# Patient Record
Sex: Male | Born: 1970 | Race: White | Hispanic: No | Marital: Single | Smoking: Never smoker
Health system: Southern US, Community
[De-identification: ages and names within clinical notes are randomized; demographics above are authoritative.]

## PROBLEM LIST (undated history)

## (undated) DIAGNOSIS — I10 Essential (primary) hypertension: Secondary | ICD-10-CM

## (undated) DIAGNOSIS — Z8659 Personal history of other mental and behavioral disorders: Secondary | ICD-10-CM

## (undated) DIAGNOSIS — F909 Attention-deficit hyperactivity disorder, unspecified type: Secondary | ICD-10-CM

## (undated) DIAGNOSIS — M1990 Unspecified osteoarthritis, unspecified site: Secondary | ICD-10-CM

## (undated) HISTORY — PX: KNEE ARTHROSCOPY: SUR90

## (undated) HISTORY — PX: ANTERIOR CERVICAL DECOMP/DISCECTOMY FUSION: SHX1161

## (undated) HISTORY — PX: INGUINAL HERNIA REPAIR: SHX194

## (undated) HISTORY — DX: Personal history of other mental and behavioral disorders: Z86.59

---

## 1998-11-24 ENCOUNTER — Emergency Department (HOSPITAL_COMMUNITY): Admission: EM | Admit: 1998-11-24 | Discharge: 1998-11-24 | Payer: Self-pay | Admitting: Emergency Medicine

## 1998-11-25 ENCOUNTER — Encounter: Payer: Self-pay | Admitting: Emergency Medicine

## 2000-03-15 ENCOUNTER — Encounter: Payer: Self-pay | Admitting: Family Medicine

## 2000-03-15 ENCOUNTER — Ambulatory Visit (HOSPITAL_COMMUNITY): Admission: RE | Admit: 2000-03-15 | Discharge: 2000-03-15 | Payer: Self-pay | Admitting: Family Medicine

## 2000-12-04 ENCOUNTER — Encounter: Payer: Self-pay | Admitting: Emergency Medicine

## 2000-12-04 ENCOUNTER — Emergency Department (HOSPITAL_COMMUNITY): Admission: EM | Admit: 2000-12-04 | Discharge: 2000-12-04 | Payer: Self-pay | Admitting: Emergency Medicine

## 2000-12-08 ENCOUNTER — Emergency Department (HOSPITAL_COMMUNITY): Admission: EM | Admit: 2000-12-08 | Discharge: 2000-12-08 | Payer: Self-pay | Admitting: Emergency Medicine

## 2000-12-08 ENCOUNTER — Encounter: Payer: Self-pay | Admitting: Emergency Medicine

## 2002-06-05 ENCOUNTER — Emergency Department (HOSPITAL_COMMUNITY): Admission: EM | Admit: 2002-06-05 | Discharge: 2002-06-05 | Payer: Self-pay | Admitting: *Deleted

## 2005-03-23 ENCOUNTER — Ambulatory Visit (HOSPITAL_COMMUNITY): Admission: RE | Admit: 2005-03-23 | Discharge: 2005-03-23 | Payer: Self-pay | Admitting: Neurological Surgery

## 2005-06-06 ENCOUNTER — Encounter: Admission: RE | Admit: 2005-06-06 | Discharge: 2005-06-06 | Payer: Self-pay | Admitting: Neurological Surgery

## 2007-01-17 ENCOUNTER — Emergency Department (HOSPITAL_COMMUNITY): Admission: EM | Admit: 2007-01-17 | Discharge: 2007-01-17 | Payer: Self-pay | Admitting: Emergency Medicine
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
## 2011-02-03 DIAGNOSIS — J309 Allergic rhinitis, unspecified: Secondary | ICD-10-CM | POA: Insufficient documentation

## 2015-10-16 DIAGNOSIS — F419 Anxiety disorder, unspecified: Secondary | ICD-10-CM | POA: Insufficient documentation

## 2015-10-16 DIAGNOSIS — F909 Attention-deficit hyperactivity disorder, unspecified type: Secondary | ICD-10-CM | POA: Insufficient documentation

## 2018-04-01 DIAGNOSIS — K420 Umbilical hernia with obstruction, without gangrene: Secondary | ICD-10-CM | POA: Insufficient documentation

## 2020-09-08 DIAGNOSIS — M542 Cervicalgia: Secondary | ICD-10-CM | POA: Insufficient documentation

## 2020-09-23 ENCOUNTER — Other Ambulatory Visit: Payer: Self-pay | Admitting: Neurological Surgery

## 2020-09-23 DIAGNOSIS — M542 Cervicalgia: Secondary | ICD-10-CM

## 2020-10-13 ENCOUNTER — Ambulatory Visit
Admission: RE | Admit: 2020-10-13 | Discharge: 2020-10-13 | Disposition: A | Discharge status: Home / Self Care | Payer: Worker's Compensation | Source: Ambulatory Visit | Attending: Neurological Surgery | Admitting: Neurological Surgery

## 2020-10-13 ENCOUNTER — Other Ambulatory Visit: Payer: Self-pay

## 2020-10-13 DIAGNOSIS — M542 Cervicalgia: Secondary | ICD-10-CM

## 2020-11-08 ENCOUNTER — Ambulatory Visit
Admission: RE | Admit: 2020-11-08 | Discharge: 2020-11-08 | Disposition: A | Discharge status: Home / Self Care | Payer: Worker's Compensation | Source: Ambulatory Visit | Attending: Neurological Surgery | Admitting: Neurological Surgery

## 2020-11-08 ENCOUNTER — Other Ambulatory Visit: Payer: Self-pay

## 2021-04-07 DIAGNOSIS — M5030 Other cervical disc degeneration, unspecified cervical region: Secondary | ICD-10-CM | POA: Insufficient documentation
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
## 2021-11-16 ENCOUNTER — Other Ambulatory Visit: Payer: Self-pay

## 2021-11-16 ENCOUNTER — Encounter (HOSPITAL_BASED_OUTPATIENT_CLINIC_OR_DEPARTMENT_OTHER): Payer: Self-pay | Admitting: Orthopaedic Surgery

## 2021-11-21 NOTE — Discharge Instructions (Addendum)
[name] Ramanathan, MD ?[facility] ? ?Please read the following information regarding your care after surgery. ? ?Medications  ?You only need a prescription for the narcotic pain medicine (ex. oxycodone, Percocet, Norco).  All of the other medicines listed below are available over the counter. ?? Aleve 2 pills twice a day for the first 3 days after surgery. ?? acetominophen (Tylenol) 650 mg every 4-6 hours as you need for minor to moderate pain ?? oxycodone as prescribed for severe pain ? ?? To help prevent blood clots, take aspirin (81 mg) twice daily for 42 days after surgery (or total duration of nonweightbearing).  You should also get up every hour while you are awake to move around. ? ?Weight Bearing ?? Do NOT bear any weight on the operated leg or foot. ? ?Cast / Splint / Dressing ?? If you have a splint, do NOT remove this. Keep your splint, cast or dressing clean and dry.  Don?t put anything (coat hanger, pencil, etc) down inside of it.  If it gets wet, call the office immediately to schedule an appointment for a cast change. ? ?Swelling ?IMPORTANT: It is normal for you to have swelling where you had surgery. To reduce swelling and pain, keep at least 3 pillows under your leg so that your toes are above your nose and your heel is above the level of your hip.  It may be necessary to keep your foot or leg elevated for several weeks.  This is critical to helping your incisions heal and your pain to feel better. ? ?Follow Up ?Call my office at [phone] when you are discharged from the hospital or surgery center to schedule an appointment to be seen 7-10 days after surgery. ? ?Call my office at [phone] if you develop a fever >101.5? F, nausea, vomiting, bleeding from the surgical site or severe pain.   ? ?=============== ?Post Anesthesia Home Care Instructions ? ?Activity: ?Get plenty of rest for the remainder of the day. A responsible individual must stay with you for 24 hours following the procedure.   ?For the next 24 hours, DO NOT: ?-Drive a car ?-Operate machinery ?-Drink alcoholic beverages ?-Take any medication unless instructed by your physician ?-Make any legal decisions or sign important papers. ? ?Meals: ?Start with liquid foods such as gelatin or soup. Progress to regular foods as tolerated. Avoid greasy, spicy, heavy foods. If nausea and/or vomiting occur, drink only clear liquids until the nausea and/or vomiting subsides. Call your physician if vomiting continues. ? ?Special Instructions/Symptoms: ?Your throat may feel dry or sore from the anesthesia or the breathing tube placed in your throat during surgery. If this causes discomfort, gargle with warm salt water. The discomfort should disappear within 24 hours. ? ?If you had a scopolamine patch placed behind your ear for the management of post- operative nausea and/or vomiting: ? ?1. The medication in the patch is effective for 72 hours, after which it should be removed.  Wrap patch in a tissue and discard in the trash. Wash hands thoroughly with soap and water. ?2. You may remove the patch earlier than 72 hours if you experience unpleasant side effects which may include dry mouth, dizziness or visual disturbances. ?3. Avoid touching the patch. Wash your hands with soap and water after contact with the patch. ?   Regional Anesthesia Blocks ? ?1. Numbness or the inability to move the "blocked" extremity may last from 3-48 hours after placement. The length of time depends on the medication injected and your individual   response to the medication. If the numbness is not going away after 48 hours, call your surgeon. ? ?2. The extremity that is blocked will need to be protected until the numbness is gone and the  Strength has returned. Because you cannot feel it, you will need to take extra care to avoid injury. Because it may be weak, you may have difficulty moving it or using it. You may not know what position it is in without looking at it while the  block is in effect. ? ?3. For blocks in the legs and feet, returning to weight bearing and walking needs to be done carefully. You will need to wait until the numbness is entirely gone and the strength has returned. You should be able to move your leg and foot normally before you try and bear weight or walk. You will need someone to be with you when you first try to ensure you do not fall and possibly risk injury. ? ?4. Bruising and tenderness at the needle site are common side effects and will resolve in a few days. ? ?5. Persistent numbness or new problems with movement should be communicated to the surgeon or the [facility] ([phone])/ [facility] ([phone]).  ?

## 2021-11-24 ENCOUNTER — Other Ambulatory Visit: Payer: Self-pay

## 2021-11-24 ENCOUNTER — Ambulatory Visit (HOSPITAL_BASED_OUTPATIENT_CLINIC_OR_DEPARTMENT_OTHER)
Admission: RE | Admit: 2021-11-24 | Discharge: 2021-11-24 | Disposition: A | Discharge status: Home / Self Care | Payer: Worker's Compensation | Attending: Orthopaedic Surgery | Admitting: Orthopaedic Surgery

## 2021-11-24 ENCOUNTER — Ambulatory Visit (HOSPITAL_BASED_OUTPATIENT_CLINIC_OR_DEPARTMENT_OTHER): Payer: Worker's Compensation | Admitting: Anesthesiology

## 2021-11-24 ENCOUNTER — Ambulatory Visit (HOSPITAL_BASED_OUTPATIENT_CLINIC_OR_DEPARTMENT_OTHER): Payer: Worker's Compensation

## 2021-11-24 ENCOUNTER — Encounter (HOSPITAL_BASED_OUTPATIENT_CLINIC_OR_DEPARTMENT_OTHER): Payer: Self-pay | Admitting: Orthopaedic Surgery

## 2021-11-24 ENCOUNTER — Encounter (HOSPITAL_BASED_OUTPATIENT_CLINIC_OR_DEPARTMENT_OTHER)
Admission: RE | Disposition: A | Discharge status: Home / Self Care | Payer: Self-pay | Source: Home / Self Care | Attending: Orthopaedic Surgery

## 2021-11-24 DIAGNOSIS — S93401A Sprain of unspecified ligament of right ankle, initial encounter: Secondary | ICD-10-CM | POA: Insufficient documentation

## 2021-11-24 DIAGNOSIS — X58XXXA Exposure to other specified factors, initial encounter: Secondary | ICD-10-CM | POA: Insufficient documentation

## 2021-11-24 DIAGNOSIS — M1990 Unspecified osteoarthritis, unspecified site: Secondary | ICD-10-CM

## 2021-11-24 DIAGNOSIS — M25371 Other instability, right ankle: Secondary | ICD-10-CM | POA: Insufficient documentation

## 2021-11-24 DIAGNOSIS — S93491A Sprain of other ligament of right ankle, initial encounter: Secondary | ICD-10-CM

## 2021-11-24 DIAGNOSIS — I10 Essential (primary) hypertension: Secondary | ICD-10-CM | POA: Diagnosis not present

## 2021-11-24 DIAGNOSIS — Z6841 Body Mass Index (BMI) 40.0 and over, adult: Secondary | ICD-10-CM | POA: Diagnosis not present

## 2021-11-24 HISTORY — DX: Essential (primary) hypertension: I10

## 2021-11-24 HISTORY — DX: Attention-deficit hyperactivity disorder, unspecified type: F90.9

## 2021-11-24 HISTORY — DX: Unspecified osteoarthritis, unspecified site: M19.90

## 2021-11-24 HISTORY — PX: LIGAMENT REPAIR: SHX5444

## 2021-11-24 SURGERY — REPAIR, LIGAMENT
Anesthesia: General | Laterality: Right

## 2021-11-24 MED ORDER — PROPOFOL 10 MG/ML IV BOLUS
INTRAVENOUS | Status: DC | PRN
  Administered 2021-11-24: 200 mg via INTRAVENOUS

## 2021-11-24 MED ORDER — FENTANYL CITRATE (PF) 100 MCG/2ML IJ SOLN
INTRAMUSCULAR | Status: AC
  Filled 2021-11-24: qty 2

## 2021-11-24 MED ORDER — LIDOCAINE 2% (20 MG/ML) 5 ML SYRINGE
INTRAMUSCULAR | Status: AC
Start: 2021-11-24 — End: ?
  Filled 2021-11-24: qty 5

## 2021-11-24 MED ORDER — 0.9 % SODIUM CHLORIDE (POUR BTL) OPTIME
TOPICAL | Status: DC | PRN
Start: 2021-11-24 — End: 2021-11-24
  Administered 2021-11-24: 60 mL

## 2021-11-24 MED ORDER — PROPOFOL 10 MG/ML IV BOLUS
INTRAVENOUS | Status: AC
  Filled 2021-11-24: qty 20

## 2021-11-24 MED ORDER — FENTANYL CITRATE (PF) 100 MCG/2ML IJ SOLN
INTRAMUSCULAR | Status: DC | PRN
  Administered 2021-11-24: 25 ug via INTRAVENOUS

## 2021-11-24 MED ORDER — CEFAZOLIN IN SODIUM CHLORIDE 3-0.9 GM/100ML-% IV SOLN
3.0000 g | INTRAVENOUS | Status: AC
  Administered 2021-11-24: 3 g via INTRAVENOUS

## 2021-11-24 MED ORDER — MEPERIDINE HCL 25 MG/ML IJ SOLN: 6.2500 mg | INTRAMUSCULAR | Status: DC | PRN

## 2021-11-24 MED ORDER — ROPIVACAINE HCL 5 MG/ML IJ SOLN
INTRAMUSCULAR | Status: DC | PRN
  Administered 2021-11-24: 30 mL via PERINEURAL

## 2021-11-24 MED ORDER — MIDAZOLAM HCL 2 MG/2ML IJ SOLN
INTRAMUSCULAR | Status: AC
  Filled 2021-11-24: qty 2

## 2021-11-24 MED ORDER — CEFAZOLIN SODIUM-DEXTROSE 2-4 GM/100ML-% IV SOLN: 2.0000 g | INTRAVENOUS | Status: DC

## 2021-11-24 MED ORDER — LIDOCAINE HCL (CARDIAC) PF 100 MG/5ML IV SOSY
PREFILLED_SYRINGE | INTRAVENOUS | Status: DC | PRN
  Administered 2021-11-24: 60 mg via INTRAVENOUS

## 2021-11-24 MED ORDER — AMISULPRIDE (ANTIEMETIC) 5 MG/2ML IV SOLN: 10.0000 mg | Freq: Once | INTRAVENOUS | Status: DC | PRN

## 2021-11-24 MED ORDER — VANCOMYCIN HCL 500 MG IV SOLR
INTRAVENOUS | Status: DC | PRN
  Administered 2021-11-24: 500 mg

## 2021-11-24 MED ORDER — PROMETHAZINE HCL 25 MG/ML IJ SOLN: 6.2500 mg | INTRAMUSCULAR | Status: DC | PRN

## 2021-11-24 MED ORDER — OXYCODONE HCL 5 MG/5ML PO SOLN: 5.0000 mg | Freq: Once | ORAL | Status: DC | PRN

## 2021-11-24 MED ORDER — OXYCODONE HCL 5 MG PO TABS: 5.0000 mg | ORAL_TABLET | Freq: Once | ORAL | Status: DC | PRN

## 2021-11-24 MED ORDER — HYDROMORPHONE HCL 1 MG/ML IJ SOLN: 0.2500 mg | INTRAMUSCULAR | Status: DC | PRN

## 2021-11-24 MED ORDER — CEFAZOLIN IN SODIUM CHLORIDE 3-0.9 GM/100ML-% IV SOLN
INTRAVENOUS | Status: AC
  Filled 2021-11-24: qty 100

## 2021-11-24 MED ORDER — LACTATED RINGERS IV SOLN: INTRAVENOUS | Status: DC

## 2021-11-24 MED ORDER — FENTANYL CITRATE (PF) 100 MCG/2ML IJ SOLN
100.0000 ug | Freq: Once | INTRAMUSCULAR | Status: AC
  Administered 2021-11-24: 100 ug via INTRAVENOUS

## 2021-11-24 MED ORDER — VANCOMYCIN HCL 500 MG IV SOLR
INTRAVENOUS | Status: AC
  Filled 2021-11-24: qty 10

## 2021-11-24 MED ORDER — DEXAMETHASONE SODIUM PHOSPHATE 10 MG/ML IJ SOLN
INTRAMUSCULAR | Status: AC
  Filled 2021-11-24: qty 1

## 2021-11-24 MED ORDER — DEXAMETHASONE SODIUM PHOSPHATE 10 MG/ML IJ SOLN
INTRAMUSCULAR | Status: DC | PRN
  Administered 2021-11-24: 10 mg via INTRAVENOUS

## 2021-11-24 MED ORDER — MIDAZOLAM HCL 2 MG/2ML IJ SOLN
2.0000 mg | Freq: Once | INTRAMUSCULAR | Status: AC
Start: 2021-11-24 — End: 2021-11-24
  Administered 2021-11-24: 2 mg via INTRAVENOUS

## 2021-11-24 MED ORDER — ONDANSETRON HCL 4 MG/2ML IJ SOLN
INTRAMUSCULAR | Status: AC
  Filled 2021-11-24: qty 2

## 2021-11-24 MED ORDER — ONDANSETRON HCL 4 MG/2ML IJ SOLN
INTRAMUSCULAR | Status: DC | PRN
  Administered 2021-11-24: 4 mg via INTRAVENOUS

## 2021-11-24 SURGICAL SUPPLY — 103 items
ANCH SUT NDL DX FBRTK STRL LF (Anchor) ×4 IMPLANT
ANCHOR SUT FBRTK 1.3 SUTTAP (Anchor) ×4 IMPLANT
APL PRP STRL LF DISP 70% ISPRP (MISCELLANEOUS) ×2
BANDAGE ESMARK 6X9 LF (GAUZE/BANDAGES/DRESSINGS) ×1 IMPLANT
BLADE AVERAGE 25X9 (BLADE) IMPLANT
BLADE SURG 15 STRL LF DISP TIS (BLADE) ×4 IMPLANT
BLADE SURG 15 STRL SS (BLADE) ×6
BNDG CMPR 9X4 STRL LF SNTH (GAUZE/BANDAGES/DRESSINGS)
BNDG CMPR 9X6 STRL LF SNTH (GAUZE/BANDAGES/DRESSINGS) ×2
BNDG CMPR MED 10X6 ELC LF (GAUZE/BANDAGES/DRESSINGS) ×2
BNDG COHESIVE 4X5 TAN ST LF (GAUZE/BANDAGES/DRESSINGS) ×1 IMPLANT
BNDG COHESIVE 6X5 TAN ST LF (GAUZE/BANDAGES/DRESSINGS) ×1 IMPLANT
BNDG ELASTIC 4X5.8 VLCR STR LF (GAUZE/BANDAGES/DRESSINGS) ×3 IMPLANT
BNDG ELASTIC 6X10 VLCR STRL LF (GAUZE/BANDAGES/DRESSINGS) ×3 IMPLANT
BNDG ESMARK 4X9 LF (GAUZE/BANDAGES/DRESSINGS) IMPLANT
BNDG ESMARK 6X9 LF (GAUZE/BANDAGES/DRESSINGS) ×3
BNDG GAUZE ELAST 4 BULKY (GAUZE/BANDAGES/DRESSINGS) ×3 IMPLANT
BOOT STEPPER DURA LG (SOFTGOODS) IMPLANT
BOOT STEPPER DURA MED (SOFTGOODS) IMPLANT
BOOT STEPPER DURA SM (SOFTGOODS) IMPLANT
BURR OVAL 8 FLU 4.0X13 (MISCELLANEOUS) IMPLANT
CHLORAPREP W/TINT 26 (MISCELLANEOUS) ×3 IMPLANT
COVER BACK TABLE 60X90IN (DRAPES) ×3 IMPLANT
CUFF TOURN SGL QUICK 34 (TOURNIQUET CUFF)
CUFF TRNQT CYL 34X4.125X (TOURNIQUET CUFF) IMPLANT
DISSECTOR  3.8MM X 13CM (MISCELLANEOUS)
DISSECTOR 3.8MM X 13CM (MISCELLANEOUS) IMPLANT
DRAPE EXTREMITY T 121X128X90 (DISPOSABLE) ×3 IMPLANT
DRAPE OEC MINIVIEW 54X84 (DRAPES) IMPLANT
DRAPE U-SHAPE 47X51 STRL (DRAPES) ×3 IMPLANT
DRSG MEPITEL 4X7.2 (GAUZE/BANDAGES/DRESSINGS) ×3 IMPLANT
DRSG PAD ABDOMINAL 8X10 ST (GAUZE/BANDAGES/DRESSINGS) ×10 IMPLANT
ELECT REM PT RETURN 9FT ADLT (ELECTROSURGICAL) ×3
ELECTRODE REM PT RTRN 9FT ADLT (ELECTROSURGICAL) ×2 IMPLANT
EXCALIBUR 3.8MM X 13CM (MISCELLANEOUS) IMPLANT
FACESHIELD WRAPAROUND (MASK) ×3 IMPLANT
FACESHIELD WRAPAROUND OR TEAM (MASK) ×1 IMPLANT
GAUZE SPONGE 4X4 12PLY STRL (GAUZE/BANDAGES/DRESSINGS) ×3 IMPLANT
GAUZE XEROFORM 1X8 LF (GAUZE/BANDAGES/DRESSINGS) ×3 IMPLANT
GLOVE BIOGEL PI IND STRL 8 (GLOVE) ×4 IMPLANT
GLOVE BIOGEL PI INDICATOR 8 (GLOVE) ×2
GLOVE SURG POLYISO LF SZ7.5 (GLOVE) ×3 IMPLANT
GLOVE SURG SS PI 7.5 STRL IVOR (GLOVE) ×3 IMPLANT
GOWN STRL REUS W/ TWL LRG LVL3 (GOWN DISPOSABLE) ×2 IMPLANT
GOWN STRL REUS W/ TWL XL LVL3 (GOWN DISPOSABLE) ×4 IMPLANT
GOWN STRL REUS W/TWL LRG LVL3 (GOWN DISPOSABLE) ×3
GOWN STRL REUS W/TWL XL LVL3 (GOWN DISPOSABLE) ×6
IV NS IRRIG 3000ML ARTHROMATIC (IV SOLUTION) ×3 IMPLANT
K-WIRE DBL .062X4 NSTRL (WIRE)
KIT FIBERTAK DX 1.6 DISP (KITS) ×2 IMPLANT
KWIRE DBL .062X4 NSTRL (WIRE) IMPLANT
MANIFOLD NEPTUNE II (INSTRUMENTS) ×1 IMPLANT
NDL HYPO 25X1 1.5 SAFETY (NEEDLE) IMPLANT
NDL SUT 6 .5 CRC .975X.05 MAYO (NEEDLE) IMPLANT
NEEDLE HYPO 22GX1.5 SAFETY (NEEDLE) IMPLANT
NEEDLE HYPO 25X1 1.5 SAFETY (NEEDLE) IMPLANT
NEEDLE MAYO TAPER (NEEDLE)
NS IRRIG 1000ML POUR BTL (IV SOLUTION) ×3 IMPLANT
PACK ARTHROSCOPY DSU (CUSTOM PROCEDURE TRAY) ×3 IMPLANT
PACK BASIN DAY SURGERY FS (CUSTOM PROCEDURE TRAY) ×3 IMPLANT
PAD CAST 4YDX4 CTTN HI CHSV (CAST SUPPLIES) ×2 IMPLANT
PADDING CAST ABS 4INX4YD NS (CAST SUPPLIES)
PADDING CAST ABS COTTON 4X4 ST (CAST SUPPLIES) IMPLANT
PADDING CAST COTTON 4X4 STRL (CAST SUPPLIES) ×3
PADDING CAST COTTON 6X4 STRL (CAST SUPPLIES) ×3 IMPLANT
PASSER SUT SWANSON 36MM LOOP (INSTRUMENTS) IMPLANT
PENCIL SMOKE EVACUATOR (MISCELLANEOUS) ×3 IMPLANT
PORT APPOLLO RF 90DEGREE MULTI (SURGICAL WAND) IMPLANT
RETRIEVER SUT HEWSON (MISCELLANEOUS) IMPLANT
SANITIZER HAND PURELL 535ML FO (MISCELLANEOUS) ×1 IMPLANT
SHAVER DISSECTOR 3.0 (BURR) IMPLANT
SHAVER SABRE 2.0 (BURR) IMPLANT
SHEET MEDIUM DRAPE 40X70 STRL (DRAPES) ×3 IMPLANT
SLEEVE SCD COMPRESS KNEE MED (STOCKING) ×3 IMPLANT
SPIKE FLUID TRANSFER (MISCELLANEOUS) IMPLANT
SPLINT FAST PLASTER 5X30 (CAST SUPPLIES) ×20
SPLINT PLASTER CAST FAST 5X30 (CAST SUPPLIES) ×40 IMPLANT
SPONGE T-LAP 18X18 ~~LOC~~+RFID (SPONGE) ×3 IMPLANT
STOCKINETTE 6  STRL (DRAPES) ×3
STOCKINETTE 6 STRL (DRAPES) ×2 IMPLANT
STRAP ANKLE FOOT DISTRACTOR (ORTHOPEDIC SUPPLIES) ×1 IMPLANT
SUCTION FRAZIER HANDLE 10FR (MISCELLANEOUS) ×3
SUCTION TUBE FRAZIER 10FR DISP (MISCELLANEOUS) ×2 IMPLANT
SUT ETHIBOND 0 MO6 C/R (SUTURE) IMPLANT
SUT ETHIBOND 2 OS 4 DA (SUTURE) IMPLANT
SUT ETHILON 3 0 PS 1 (SUTURE) ×3 IMPLANT
SUT FIBERWIRE 2-0 18 17.9 3/8 (SUTURE)
SUT MERSILENE 2.0 SH NDLE (SUTURE) IMPLANT
SUT MNCRL AB 3-0 PS2 18 (SUTURE) ×3 IMPLANT
SUT VIC AB 2-0 CT1 27 (SUTURE)
SUT VIC AB 2-0 CT1 TAPERPNT 27 (SUTURE) IMPLANT
SUT VIC AB 2-0 SH 27 (SUTURE)
SUT VIC AB 2-0 SH 27XBRD (SUTURE) IMPLANT
SUT VIC AB 3-0 SH 27 (SUTURE)
SUT VIC AB 3-0 SH 27X BRD (SUTURE) IMPLANT
SUT VICRYL 0 SH 27 (SUTURE) IMPLANT
SUTURE FIBERWR 2-0 18 17.9 3/8 (SUTURE) IMPLANT
SYR BULB EAR ULCER 3OZ GRN STR (SYRINGE) ×3 IMPLANT
TOWEL GREEN STERILE FF (TOWEL DISPOSABLE) ×6 IMPLANT
TUBE CONNECTING 20X1/4 (TUBING) ×3 IMPLANT
TUBING ARTHROSCOPY IRRIG 16FT (MISCELLANEOUS) ×1 IMPLANT
UNDERPAD 30X36 HEAVY ABSORB (UNDERPADS AND DIAPERS) ×3 IMPLANT
WATER STERILE IRR 1000ML POUR (IV SOLUTION) ×3 IMPLANT

## 2021-11-24 NOTE — Transfer of Care (Signed)
Immediate Anesthesia Transfer of Care Note ? ?Patient: [name] [name] ? ?Procedure(s) Performed: Right Ankle Lateral Ligament Reconstruction (Right) ? ?Patient Location: PACU ? ?Anesthesia Type:General ? ?Level of Consciousness: drowsy ? ?Airway & Oxygen Therapy: Patient Spontanous Breathing and Patient connected to face mask oxygen ? ?Post-op Assessment: Report given to RN and Post -op Vital signs reviewed and stable ? ?Post vital signs: Reviewed and stable ? ?Last Vitals:  ?Vitals Value Taken Time  ?BP 129/73 11/24/21 1545  ?Temp    ?Pulse 79 11/24/21 1546  ?Resp 22 11/24/21 1546  ?SpO2 100 % 11/24/21 1546  ? ? ?Last Pain:  ?Vitals:  ? 11/24/21 1222  ?TempSrc: Oral  ?PainSc: 0-No pain  ?   ? ?Patients Stated Pain Goal: 2 (11/24/21 1222) ? ?Complications: No notable events documented. ?

## 2021-11-24 NOTE — Anesthesia Preprocedure Evaluation (Signed)
Anesthesia Evaluation  ?Patient identified by MRN, date of birth, ID band ?Patient awake ? ? ? ?Reviewed: ?Allergy & Precautions, NPO status , Patient's Chart, lab work & pertinent test results ? ?Airway ?Mallampati: II ? ?TM Distance: >3 FB ?Neck ROM: Full ? ? ? Dental ?no notable dental hx. ? ?  ?Pulmonary ?neg pulmonary ROS,  ?  ?Pulmonary exam normal ?breath sounds clear to auscultation ? ? ? ? ? ? Cardiovascular ?hypertension, Pt. on medications ?negative cardio ROS ?Normal cardiovascular exam ?Rhythm:Regular Rate:Normal ? ? ?  ?Neuro/Psych ?negative neurological ROS ? negative psych ROS  ? GI/Hepatic ?negative GI ROS, Neg liver ROS,   ?Endo/Other  ?Morbid obesity ? Renal/GU ?negative Renal ROS  ?negative genitourinary ?  ?Musculoskeletal ? ?(+) Arthritis , Osteoarthritis,   ? Abdominal ?(+) + obese,   ?Peds ?negative pediatric ROS ?(+)  Hematology ?negative hematology ROS ?(+)   ?Anesthesia Other Findings ? ? Reproductive/Obstetrics ?negative OB ROS ? ?  ? ? ? ? ? ? ? ? ? ? ? ? ? ?  ?  ? ? ? ? ? ? ? ? ?Anesthesia Physical ?Anesthesia Plan ? ?ASA: 3 ? ?Anesthesia Plan: General  ? ?Post-op Pain Management: Regional block* and Minimal or no pain anticipated  ? ?Induction: Intravenous ? ?PONV Risk Score and Plan: 2 and Ondansetron, Midazolam and Treatment may vary due to age or medical condition ? ?Airway Management Planned: LMA ? ?Additional Equipment:  ? ?Intra-op Plan:  ? ?Post-operative Plan: Extubation in OR ? ?Informed Consent: I have reviewed the patients History and Physical, chart, labs and discussed the procedure including the risks, benefits and alternatives for the proposed anesthesia with the patient or authorized representative who has indicated his/her understanding and acceptance.  ? ? ? ?Dental advisory given ? ?Plan Discussed with: CRNA ? ?Anesthesia Plan Comments:   ? ? ? ? ? ? ?Anesthesia Quick Evaluation ? ?
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
## 2021-11-24 NOTE — H&P (Signed)
H&P Update: ? ?-History and Physical Reviewed ? ?-Patient has been re-examined ? ?-No change in the plan of care ? ?-The risks and benefits were presented and reviewed. The risks due to hardware failure/irritation, new/persistent infection, stiffness, nerve/vessel/tendon injury, wound healing issues, development of arthritis, failure of this surgery, possibility of delayed definitive surgery, need for further surgery, thromboembolic events, anesthesia/medical complications, amputation, death among others were discussed. The patient acknowledged the explanation, agreed to proceed with the plan and a consent was signed. ? ?[name] [name] ? ?

## 2021-11-24 NOTE — Anesthesia Procedure Notes (Signed)
Procedure Name: LMA Insertion ?Date/Time: 11/24/2021 2:26 PM ?Performed by: Burna Cash, CRNA ?Pre-anesthesia Checklist: Patient identified, Emergency Drugs available, Suction available and Patient being monitored ?Patient Re-evaluated:Patient Re-evaluated prior to induction ?Oxygen Delivery Method: Circle system utilized ?Preoxygenation: Pre-oxygenation with 100% oxygen ?Induction Type: IV induction ?Ventilation: Mask ventilation without difficulty ?LMA: LMA inserted ?LMA Size: 5.0 ?Number of attempts: 1 ?Airway Equipment and Method: Bite block ?Placement Confirmation: positive ETCO2 ?Tube secured with: Tape ?Dental Injury: Teeth and Oropharynx as per pre-operative assessment  ? ? ? ? ?

## 2021-11-24 NOTE — Progress Notes (Signed)
Assisted Dr. Miller with right, popliteal, ultrasound guided block. Side rails up, monitors on throughout procedure. See vital signs in flow sheet. Tolerated Procedure well. ?

## 2021-11-24 NOTE — Anesthesia Postprocedure Evaluation (Signed)
Anesthesia Post Note ? ?Patient: [name] [name] ? ?Procedure(s) Performed: Right Ankle Lateral Ligament Reconstruction (Right) ? ?  ? ?Patient location during evaluation: PACU ?Anesthesia Type: General ?Level of consciousness: awake and alert ?Pain management: pain level controlled ?Vital Signs Assessment: post-procedure vital signs reviewed and stable ?Respiratory status: spontaneous breathing, nonlabored ventilation and respiratory function stable ?Cardiovascular status: blood pressure returned to baseline and stable ?Postop Assessment: no apparent nausea or vomiting ?Anesthetic complications: no ? ? ?No notable events documented. ? ?Last Vitals:  ?Vitals:  ? 11/24/21 1610 11/24/21 1621  ?BP:  113/83  ?Pulse: 74 78  ?Resp: 17 17  ?Temp:  36.7 ?C  ?SpO2: 95% 96%  ?  ?Last Pain:  ?Vitals:  ? 11/24/21 1621  ?TempSrc: Oral  ?PainSc: 0-No pain  ? ? ?  ?  ?  ?  ?  ?  ? ?Lowella Curb ? ? ? ? ?
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
## 2021-11-24 NOTE — Anesthesia Procedure Notes (Signed)
Anesthesia Regional Block: Popliteal block  ? ?Pre-Anesthetic Checklist: , timeout performed,  Correct Patient, Correct Site, Correct Laterality,  Correct Procedure, Correct Position, site marked,  Risks and benefits discussed,  Surgical consent,  Pre-op evaluation,  At surgeon's request and post-op pain management ? ?Laterality: Right ? ?Prep: chloraprep     ?  ?Needles:  ?Injection technique: Single-shot ? ?Needle Type: Stimiplex   ? ? ?Needle Length: 9cm  ?Needle Gauge: 21  ? ? ? ?Additional Needles: ? ? ?Procedures:,,,, ultrasound used (permanent image in chart),,    ?Narrative:  ?Start time: 11/24/2021 1:54 PM ?End time: 11/24/2021 1:59 PM ?Injection made incrementally with aspirations every 5 mL. ? ?Performed by: Personally  ?Anesthesiologist: Lowella Curb, MD ? ? ? ? ?

## 2021-11-29 ENCOUNTER — Encounter (HOSPITAL_BASED_OUTPATIENT_CLINIC_OR_DEPARTMENT_OTHER): Payer: Self-pay | Admitting: Orthopaedic Surgery

## 2021-12-10 ENCOUNTER — Ambulatory Visit: Payer: Self-pay | Admitting: Medical-Surgical

## 2022-01-21 ENCOUNTER — Encounter: Payer: Self-pay | Admitting: Family Medicine

## 2022-01-21 ENCOUNTER — Ambulatory Visit (INDEPENDENT_AMBULATORY_CARE_PROVIDER_SITE_OTHER): Payer: Self-pay | Admitting: Family Medicine

## 2022-01-21 VITALS — BP 130/83 | HR 74 | Ht 68.0 in | Wt 290.0 lb

## 2022-01-21 DIAGNOSIS — F909 Attention-deficit hyperactivity disorder, unspecified type: Secondary | ICD-10-CM

## 2022-01-21 DIAGNOSIS — R5383 Other fatigue: Secondary | ICD-10-CM

## 2022-01-21 DIAGNOSIS — I10 Essential (primary) hypertension: Secondary | ICD-10-CM

## 2022-01-21 MED ORDER — AMPHETAMINE-DEXTROAMPHETAMINE 30 MG PO TABS: 30.0000 mg | ORAL_TABLET | Freq: Every day | ORAL | 0 refills | Status: DC

## 2022-01-21 MED ORDER — LISINOPRIL 10 MG PO TABS: 10.0000 mg | ORAL_TABLET | Freq: Every day | ORAL | 1 refills | Status: DC

## 2022-01-21 NOTE — Patient Instructions (Signed)
Nice to meet you today! Continue lisinopril for blood pressure.  We'll be in touch with lab results.  I think we should get testing for sleep apnea set up once you have insurance.   See me again in 3 months.

## 2022-01-22 LAB — CBC WITH DIFFERENTIAL/PLATELET
Absolute Monocytes: 946 cells/uL (ref 200–950)
Basophils Absolute: 137 cells/uL (ref 0–200)
Basophils Relative: 1.2 %
Eosinophils Absolute: 456 cells/uL (ref 15–500)
Eosinophils Relative: 4 %
HCT: 44.4 % (ref 38.5–50.0)
Hemoglobin: 15.2 g/dL (ref 13.2–17.1)
Lymphs Abs: 2223 cells/uL (ref 850–3900)
MCH: 31.5 pg (ref 27.0–33.0)
MCHC: 34.2 g/dL (ref 32.0–36.0)
MCV: 92.1 fL (ref 80.0–100.0)
MPV: 10.6 fL (ref 7.5–12.5)
Monocytes Relative: 8.3 %
Neutro Abs: 7638 cells/uL (ref 1500–7800)
Neutrophils Relative %: 67 %
Platelets: 275 10*3/uL (ref 140–400)
RBC: 4.82 10*6/uL (ref 4.20–5.80)
RDW: 13.6 % (ref 11.0–15.0)
Total Lymphocyte: 19.5 %
WBC: 11.4 10*3/uL — ABNORMAL HIGH (ref 3.8–10.8)

## 2022-01-22 LAB — BASIC METABOLIC PANEL
BUN: 17 mg/dL (ref 7–25)
CO2: 24 mmol/L (ref 20–32)
Calcium: 9 mg/dL (ref 8.6–10.3)
Chloride: 103 mmol/L (ref 98–110)
Creat: 1.17 mg/dL (ref 0.70–1.30)
Glucose, Bld: 110 mg/dL — ABNORMAL HIGH (ref 65–99)
Potassium: 4.2 mmol/L (ref 3.5–5.3)
Sodium: 137 mmol/L (ref 135–146)

## 2022-01-22 LAB — HEMOGLOBIN A1C
Hgb A1c MFr Bld: 5.5 % of total Hgb (ref <5.7)
Mean Plasma Glucose: 111 mg/dL
eAG (mmol/L): 6.2 mmol/L

## 2022-01-23 DIAGNOSIS — R5383 Other fatigue: Secondary | ICD-10-CM | POA: Insufficient documentation

## 2022-01-23 DIAGNOSIS — I10 Essential (primary) hypertension: Secondary | ICD-10-CM | POA: Insufficient documentation

## 2022-01-23 NOTE — Assessment & Plan Note (Signed)
I agree that he needs an updated sleep study and he will let me know when he has insurance.  Checking labs including A1c today with his recent weight gain.

## 2022-01-23 NOTE — Assessment & Plan Note (Signed)
Blood pressure is well controlled at this time with lisinopril.  Recommend continuation of current strength.  Updating labs today.
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
## 2022-01-23 NOTE — Assessment & Plan Note (Signed)
He is doing quite well with Adderall 30 mg daily as needed.  Previous records reviewed from [facility] family practice and PDMP reviewed.  We will continue this at current strength.

## 2022-04-25 ENCOUNTER — Ambulatory Visit (INDEPENDENT_AMBULATORY_CARE_PROVIDER_SITE_OTHER): Payer: Self-pay | Admitting: Family Medicine

## 2022-04-25 ENCOUNTER — Encounter: Payer: Self-pay | Admitting: Family Medicine

## 2022-04-25 DIAGNOSIS — F909 Attention-deficit hyperactivity disorder, unspecified type: Secondary | ICD-10-CM

## 2022-04-25 MED ORDER — AMPHETAMINE-DEXTROAMPHETAMINE 30 MG PO TABS: 30.0000 mg | ORAL_TABLET | Freq: Every day | ORAL | 0 refills | Status: DC

## 2022-04-25 NOTE — Assessment & Plan Note (Signed)
Referred manufacture that [facility] uses for Adderall 30 mg tablets.  Updated prescription sent.  Alternatively we discussed switching to 20 mg tablets and taking 2 of these daily for slightly increased dose if 30 mg tablet is not working.

## 2022-04-25 NOTE — Progress Notes (Signed)
[name] [name] - 51 y.o. male MRN [id number]  Date of birth: 1970-11-06  Subjective Chief Complaint  Patient presents with   ADD    HPI [name] [name] is a 51 year old male here today for follow-up visit.  Reports overall he is doing well.  His Adderall came from a new manufacturer recently.  He does not know if this works as well.  Previous pill was overall unchanged.  Current pill is round.  He is getting oval pill from [facility].  He would like to try switching back to this pharmacy.  Overall he is tolerating well without side effects.  Has not had any sleeping difficulty, increased anxiety, palpitations or appetite change.  ROS:  A comprehensive ROS was completed and negative except as noted per HPI  Allergies  Allergen Reactions   Morphine Hives    Past Medical History:  Diagnosis Date   ADHD (attention deficit hyperactivity disorder)    Arthritis    Chest pain    Related to mental stress   History of anxiety    History of depression    Hypertension     Past Surgical History:  Procedure Laterality Date   ANTERIOR CERVICAL DECOMP/DISCECTOMY FUSION     INGUINAL HERNIA REPAIR     KNEE ARTHROSCOPY Right    LIGAMENT REPAIR Right 11/24/2021   Procedure: Right Ankle Lateral Ligament Reconstruction;  Surgeon: Netta Cedars, MD;  Location: [facility];  Service: Orthopedics;  Laterality: Right;    Social History   Socioeconomic History   Marital status: Single    Spouse name: Not on file   Number of children: Not on file   Years of education: Not on file   Highest education level: Not on file  Occupational History   Not on file  Tobacco Use   Smoking status: Never    Passive exposure: Never   Smokeless tobacco: Current    Types: Chew  Vaping Use   Vaping Use: Not on file  Substance and Sexual Activity   Alcohol use: Not Currently    Alcohol/week: 0.0 - 2.0 standard drinks of alcohol    Comment: occassionally   Drug use: Never   Sexual activity: Yes     Partners: Female  Other Topics Concern   Not on file  Social History Narrative   Not on file   Social Determinants of Health   Financial Resource Strain: Not on file  Food Insecurity: Not on file  Transportation Needs: Not on file  Physical Activity: Not on file  Stress: Not on file  Social Connections: Not on file    Family History  Problem Relation Age of Onset   Heart disease Mother    Hypertension Father    Bone cancer Father     Health Maintenance  Topic Date Due   COVID-19 Vaccine (1) 09/08/2022 (Originally 11/08/1971)   Zoster Vaccines- Shingrix (1 of 2) 09/08/2022 (Originally 05/09/2021)   INFLUENZA VACCINE  11/06/2022 (Originally 03/08/2022)   COLONOSCOPY (Pts 45-71yrs Insurance coverage will need to be confirmed)  01/22/2023 (Originally 05/09/2016)   Hepatitis C Screening  01/22/2023 (Originally 05/09/1989)   HIV Screening  01/22/2023 (Originally 05/09/1986)   TETANUS/TDAP  08/09/2027   HPV VACCINES  Aged Out     ----------------------------------------------------------------------------------------------------------------------------------------------------------------------------------------------------------------- Physical Exam BP (!) 146/92 (BP Location: Right Arm, Patient Position: Sitting, Cuff Size: Large)   Pulse (!) 102   Ht 5\' 8"  (1.727 m)   Wt 291 lb (132 kg)   SpO2 97%   BMI 44.25 kg/m  Physical Exam Constitutional:      Appearance: Normal appearance.  Eyes:     General: No scleral icterus. Cardiovascular:     Rate and Rhythm: Normal rate and regular rhythm.  Pulmonary:     Effort: Pulmonary effort is normal.     Breath sounds: Normal breath sounds.  Musculoskeletal:     Cervical back: Neck supple.  Neurological:     Mental Status: He is alert.  Psychiatric:        Mood and Affect: Mood normal.        Behavior: Behavior normal.      ------------------------------------------------------------------------------------------------------------------------------------------------------------------------------------------------------------------- Assessment and Plan  ADHD (attention deficit hyperactivity disorder) Referred manufacture that [facility] uses for Adderall 30 mg tablets.  Updated prescription sent.  Alternatively we discussed switching to 20 mg tablets and taking 2 of these daily for slightly increased dose if 30 mg tablet is not working.   Meds ordered this encounter  Medications   amphetamine-dextroamphetamine (ADDERALL) 30 MG tablet    Sig: Take 1 tablet by mouth daily.    Dispense:  90 tablet    Refill:  0    Return in about 6 months (around 10/24/2022) for ADD.    This visit occurred during the SARS-CoV-2 public health emergency.  Safety protocols were in place, including screening questions prior to the visit, additional usage of staff PPE, and extensive cleaning of exam room while observing appropriate contact time as indicated for disinfecting solutions.

## 2022-05-02 ENCOUNTER — Telehealth: Payer: Self-pay | Admitting: Family Medicine

## 2022-05-02 ENCOUNTER — Other Ambulatory Visit: Payer: Self-pay

## 2022-05-02 MED ORDER — LISINOPRIL 10 MG PO TABS
10.0000 mg | ORAL_TABLET | Freq: Every day | ORAL | 1 refills | Status: DC
Start: 2022-05-02 — End: 2022-10-17

## 2022-05-02 NOTE — Telephone Encounter (Signed)
Patient's wife stated husband is out of lisinopril and needs a refill as soon as possible.

## 2022-05-02 NOTE — Telephone Encounter (Signed)
Thanks for sending, Panya.

## 2022-07-06 ENCOUNTER — Encounter: Payer: Self-pay | Admitting: Family Medicine

## 2022-07-26 ENCOUNTER — Telehealth: Payer: Self-pay | Admitting: Family Medicine

## 2022-07-26 ENCOUNTER — Other Ambulatory Visit: Payer: Self-pay | Admitting: Family Medicine

## 2022-07-26 MED ORDER — AMPHETAMINE-DEXTROAMPHETAMINE 30 MG PO TABS: 30.0000 mg | ORAL_TABLET | Freq: Every day | ORAL | 0 refills | Status: DC

## 2022-07-26 NOTE — Telephone Encounter (Signed)
Patient called is almost out of his amphetamine-dextroamphetamine 30 MG tablet cheduled in March 2024 he is asking for refills

## 2022-07-26 NOTE — Telephone Encounter (Signed)
Updated rx sent in.   Thanks!  CM

## 2022-07-28 ENCOUNTER — Telehealth: Payer: Self-pay | Admitting: Family Medicine

## 2022-07-28 MED ORDER — AMPHETAMINE-DEXTROAMPHETAMINE 30 MG PO TABS: 30.0000 mg | ORAL_TABLET | Freq: Every day | ORAL | 0 refills | Status: DC

## 2022-07-28 NOTE — Telephone Encounter (Signed)
Rx sent 

## 2022-07-28 NOTE — Telephone Encounter (Signed)
Patient states that [facility] has Adderall on back order,so he is requesting you send it to [facility] in [place] Today please and let him know if this is has been done so he can go by there and pick it up. Thank you

## 2022-07-28 NOTE — Telephone Encounter (Signed)
I let pt know Rx has been sent. Thank you

## 2022-07-28 NOTE — Telephone Encounter (Signed)
Updated pharmacy. Pended order sent to Dr. Ashley Royalty.

## 2022-10-15 ENCOUNTER — Other Ambulatory Visit: Payer: Self-pay | Admitting: Family Medicine

## 2022-10-24 ENCOUNTER — Ambulatory Visit (INDEPENDENT_AMBULATORY_CARE_PROVIDER_SITE_OTHER): Payer: Self-pay | Admitting: Family Medicine

## 2022-10-24 ENCOUNTER — Encounter: Payer: Self-pay | Admitting: Family Medicine

## 2022-10-24 VITALS — BP 154/109 | HR 111 | Ht 68.0 in | Wt 273.0 lb

## 2022-10-24 DIAGNOSIS — F909 Attention-deficit hyperactivity disorder, unspecified type: Secondary | ICD-10-CM

## 2022-10-24 DIAGNOSIS — M5030 Other cervical disc degeneration, unspecified cervical region: Secondary | ICD-10-CM

## 2022-10-24 DIAGNOSIS — I10 Essential (primary) hypertension: Secondary | ICD-10-CM

## 2022-10-24 MED ORDER — IBUPROFEN 800 MG PO TABS: 800.0000 mg | ORAL_TABLET | Freq: Three times a day (TID) | ORAL | 3 refills | Status: AC | PRN

## 2022-10-24 NOTE — Progress Notes (Signed)
[name] [name] - 52 y.o. male MRN [id number]  Date of birth: [birth year]  Subjective Chief Complaint  Patient presents with   Hypertension   Back Pain    HPI [name] [name] with a 52 year old male here today for follow-up visit.  He reports he is feeling pretty well.  He feels that current dose of Adderall at 30 mg daily is working well for him.  He denies side effects with this.  He is sleeping fairly well.  Has not had palpitations or anxiety.  Blood pressure is elevated.  He is taking lisinopril 10 mg daily.  He reports blood pressures at home have been better.  Denies symptoms related to hypertension.  Requesting refill of ibuprofen which she takes for neck and back pain as needed.  Does not use this too often.  ROS:  A comprehensive ROS was completed and negative except as noted per HPI  Allergies  Allergen Reactions   Morphine Hives    Past Medical History:  Diagnosis Date   ADHD (attention deficit hyperactivity disorder)    Arthritis    Chest pain    Related to mental stress   History of anxiety    History of depression    Hypertension     Past Surgical History:  Procedure Laterality Date   ANTERIOR CERVICAL DECOMP/DISCECTOMY FUSION     INGUINAL HERNIA REPAIR     KNEE ARTHROSCOPY Right    LIGAMENT REPAIR Right 11/24/2021   Procedure: Right Ankle Lateral Ligament Reconstruction;  Surgeon: Armond Hang, MD;  Location: [facility];  Service: Orthopedics;  Laterality: Right;    Social History   Socioeconomic History   Marital status: Single    Spouse name: Not on file   Number of children: Not on file   Years of education: Not on file   Highest education level: Not on file  Occupational History   Not on file  Tobacco Use   Smoking status: Never    Passive exposure: Never   Smokeless tobacco: Current    Types: Chew  Vaping Use   Vaping Use: Not on file  Substance and Sexual Activity   Alcohol use: Not Currently    Alcohol/week: 0.0 - 2.0  standard drinks of alcohol    Comment: occassionally   Drug use: Never   Sexual activity: Yes    Partners: Female  Other Topics Concern   Not on file  Social History Narrative   Not on file   Social Determinants of Health   Financial Resource Strain: Not on file  Food Insecurity: Not on file  Transportation Needs: Not on file  Physical Activity: Not on file  Stress: Not on file  Social Connections: Not on file    Family History  Problem Relation Age of Onset   Heart disease Mother    Hypertension Father    Bone cancer Father     Health Maintenance  Topic Date Due   INFLUENZA VACCINE  11/06/2022 (Originally 03/08/2022)   COLONOSCOPY (Pts 45-25yrs Insurance coverage will need to be confirmed)  01/22/2023 (Originally 05/09/2016)   Hepatitis C Screening  01/22/2023 (Originally 05/09/1989)   HIV Screening  01/22/2023 (Originally 05/09/1986)   COVID-19 Vaccine (1) 11/09/2023 (Originally 11/08/1971)   Zoster Vaccines- Shingrix (1 of 2) 01/24/2024 (Originally 05/09/2021)   DTaP/Tdap/Td (2 - Td or Tdap) 08/09/2027   HPV VACCINES  Aged Out     ----------------------------------------------------------------------------------------------------------------------------------------------------------------------------------------------------------------- Physical Exam BP (!) 154/109 (BP Location: Left Arm, Patient Position: Sitting, Cuff Size: Large)  Pulse (!) 111   Ht 5\' 8"  (1.727 m)   Wt 273 lb (123.8 kg)   SpO2 98%   BMI 41.51 kg/m   Physical Exam Constitutional:      Appearance: Normal appearance.  HENT:     Head: Normocephalic and atraumatic.  Eyes:     General: No scleral icterus. Cardiovascular:     Rate and Rhythm: Normal rate and regular rhythm.  Pulmonary:     Effort: Pulmonary effort is normal.     Breath sounds: Normal breath sounds.  Musculoskeletal:     Cervical back: Neck supple.  Neurological:     General: No focal deficit present.     Mental Status: He is  alert.  Psychiatric:        Mood and Affect: Mood normal.        Behavior: Behavior normal.     ------------------------------------------------------------------------------------------------------------------------------------------------------------------------------------------------------------------- Assessment and Plan  Essential hypertension Blood pressure is elevated today.  He will return in 2 weeks for blood pressure recheck.  May need to increase lisinopril if this remains elevated.  ADHD (attention deficit hyperactivity disorder) Doing well with Adderall at current strength.  Will plan to continue this.  DDD (degenerative disc disease), cervical Ibuprofen renewed.   Meds ordered this encounter  Medications   ibuprofen (ADVIL) 800 MG tablet    Sig: Take 1 tablet (800 mg total) by mouth every 8 (eight) hours as needed for moderate pain.    Dispense:  30 tablet    Refill:  3    Return in about 6 months (around 04/26/2023) for ADHD/HTN.    This visit occurred during the SARS-CoV-2 public health emergency.  Safety protocols were in place, including screening questions prior to the visit, additional usage of staff PPE, and extensive cleaning of exam room while observing appropriate contact time as indicated for disinfecting solutions.

## 2022-10-24 NOTE — Assessment & Plan Note (Signed)
Doing well with Adderall at current strength.  Will plan to continue this.

## 2022-10-24 NOTE — Assessment & Plan Note (Signed)
Blood pressure is elevated today.  He will return in 2 weeks for blood pressure recheck.  May need to increase lisinopril if this remains elevated.

## 2022-10-24 NOTE — Assessment & Plan Note (Signed)
Ibuprofen renewed

## 2022-11-07 ENCOUNTER — Ambulatory Visit: Payer: Self-pay

## 2022-11-10 ENCOUNTER — Ambulatory Visit: Payer: Self-pay

## 2022-11-10 ENCOUNTER — Other Ambulatory Visit: Payer: Self-pay | Admitting: Neurological Surgery

## 2022-11-10 DIAGNOSIS — M5412 Radiculopathy, cervical region: Secondary | ICD-10-CM

## 2022-12-01 ENCOUNTER — Ambulatory Visit
Admission: RE | Admit: 2022-12-01 | Discharge: 2022-12-01 | Disposition: A | Discharge status: Home / Self Care | Payer: Worker's Compensation | Source: Ambulatory Visit | Attending: Neurological Surgery | Admitting: Neurological Surgery

## 2022-12-01 DIAGNOSIS — M5412 Radiculopathy, cervical region: Secondary | ICD-10-CM

## 2023-01-26 ENCOUNTER — Telehealth: Payer: Self-pay | Admitting: Family Medicine

## 2023-01-26 MED ORDER — AMPHETAMINE-DEXTROAMPHETAMINE 30 MG PO TABS: 30.0000 mg | ORAL_TABLET | Freq: Every day | ORAL | 0 refills | Status: DC

## 2023-01-26 NOTE — Telephone Encounter (Signed)
Patient called requesting a refill of amphetamine-dextroamphetamine (ADDERALL) 30 MG tablet [[id number]]   Pharmacy ;   [id number] Lorenza Evangelist, [place] - [address] AT [facility] [address]

## 2023-01-26 NOTE — Telephone Encounter (Signed)
Completed.

## 2023-02-10 ENCOUNTER — Other Ambulatory Visit: Payer: Self-pay

## 2023-02-10 MED ORDER — LISINOPRIL 10 MG PO TABS: ORAL_TABLET | ORAL | 0 refills | Status: DC

## 2023-04-28 ENCOUNTER — Encounter: Payer: Self-pay | Admitting: Family Medicine

## 2023-04-28 ENCOUNTER — Ambulatory Visit (INDEPENDENT_AMBULATORY_CARE_PROVIDER_SITE_OTHER): Payer: Self-pay | Admitting: Family Medicine

## 2023-04-28 VITALS — BP 141/95 | HR 84 | Ht 68.0 in | Wt 272.0 lb

## 2023-04-28 DIAGNOSIS — F909 Attention-deficit hyperactivity disorder, unspecified type: Secondary | ICD-10-CM

## 2023-04-28 DIAGNOSIS — I10 Essential (primary) hypertension: Secondary | ICD-10-CM

## 2023-04-28 MED ORDER — AMPHETAMINE-DEXTROAMPHETAMINE 30 MG PO TABS: 30.0000 mg | ORAL_TABLET | Freq: Every day | ORAL | 0 refills | Status: DC

## 2023-04-28 MED ORDER — LISINOPRIL 20 MG PO TABS: 20.0000 mg | ORAL_TABLET | Freq: Every day | ORAL | 2 refills | Status: DC

## 2023-04-28 NOTE — Assessment & Plan Note (Signed)
Doing well with Adderall at current strength.  Will plan to continue this.

## 2023-04-28 NOTE — Assessment & Plan Note (Signed)
Blood pressure is elevated today. Increasing lisinopril to 20mg  daily.  Updated rx sent in.

## 2023-04-28 NOTE — Progress Notes (Signed)
[name] [name] - 52 y.o. male MRN [id number]  Date of birth: [birth year]  Subjective Chief Complaint  Patient presents with   Hypertension   ADHD    HPI [name] [name] is a 52 y.o. male here today for follow up visit.   He reports that he is doing pretty well.   Continues on lisinopril 10mg  daily for treatment of HTN.  BP is elevated today.  Denies symptoms including chest pain, shortness of breath, palpitations, headache or vision changes.    Remains on adderall 30mg  daily.  This is working well for him.  He denies side effects at current strength.  Would like to continue this.   ROS:  A comprehensive ROS was completed and negative except as noted per HPI  Allergies  Allergen Reactions   Morphine Hives    Past Medical History:  Diagnosis Date   ADHD (attention deficit hyperactivity disorder)    Arthritis    Chest pain    Related to mental stress   History of anxiety    History of depression    Hypertension     Past Surgical History:  Procedure Laterality Date   ANTERIOR CERVICAL DECOMP/DISCECTOMY FUSION     INGUINAL HERNIA REPAIR     KNEE ARTHROSCOPY Right    LIGAMENT REPAIR Right 11/24/2021   Procedure: Right Ankle Lateral Ligament Reconstruction;  Surgeon: Netta Cedars, MD;  Location: [facility];  Service: Orthopedics;  Laterality: Right;    Social History   Socioeconomic History   Marital status: Single    Spouse name: Not on file   Number of children: Not on file   Years of education: Not on file   Highest education level: Not on file  Occupational History   Not on file  Tobacco Use   Smoking status: Never    Passive exposure: Never   Smokeless tobacco: Current    Types: Chew  Vaping Use   Vaping status: Not on file  Substance and Sexual Activity   Alcohol use: Not Currently    Alcohol/week: 0.0 - 2.0 standard drinks of alcohol    Comment: occassionally   Drug use: Never   Sexual activity: Yes    Partners: Female  Other Topics  Concern   Not on file  Social History Narrative   Not on file   Social Determinants of Health   Financial Resource Strain: Not on file  Food Insecurity: No Food Insecurity (10/14/2020)   Received from [facility], [facility]   Hunger Vital Sign    Worried About Running Out of Food in the Last Year: Never true    Ran Out of Food in the Last Year: Never true  Transportation Needs: Not on file  Physical Activity: Not on file  Stress: Not on file  Social Connections: Unknown (12/11/2021)   Received from [facility], [facility]   Social Network    Social Network: Not on file    Family History  Problem Relation Age of Onset   Heart disease Mother    Hypertension Father    Bone cancer Father     Health Maintenance  Topic Date Due   COVID-19 Vaccine (1 - 2023-24 season) 06/14/2023 (Originally 04/09/2023)   INFLUENZA VACCINE  11/06/2023 (Originally 03/09/2023)   Zoster Vaccines- Shingrix (1 of 2) 01/24/2024 (Originally 05/09/2021)   Colonoscopy  04/27/2024 (Originally 05/09/2016)   Hepatitis C Screening  04/27/2024 (Originally 05/09/1989)   HIV Screening  04/27/2024 (Originally 05/09/1986)   DTaP/Tdap/Td (2 - Td  or Tdap) 08/09/2027   HPV VACCINES  Aged Out     ----------------------------------------------------------------------------------------------------------------------------------------------------------------------------------------------------------------- Physical Exam BP (!) 141/95 (BP Location: Right Arm, Patient Position: Sitting, Cuff Size: Large)   Pulse 84   Ht 5\' 8"  (1.727 m)   Wt 272 lb (123.4 kg)   SpO2 99%   BMI 41.36 kg/m   Physical Exam Constitutional:      Appearance: Normal appearance.  Eyes:     General: No scleral icterus. Cardiovascular:     Rate and Rhythm: Normal rate and regular rhythm.  Pulmonary:     Effort: Pulmonary effort is normal.     Breath sounds: Normal breath sounds.  Neurological:     General: No focal deficit present.      Mental Status: He is alert.  Psychiatric:        Mood and Affect: Mood normal.        Behavior: Behavior normal.     ------------------------------------------------------------------------------------------------------------------------------------------------------------------------------------------------------------------- Assessment and Plan  ADHD (attention deficit hyperactivity disorder) Doing well with Adderall at current strength.  Will plan to continue this.  Essential hypertension Blood pressure is elevated today. Increasing lisinopril to 20mg  daily.  Updated rx sent in.      Meds ordered this encounter  Medications   amphetamine-dextroamphetamine (ADDERALL) 30 MG tablet    Sig: Take 1 tablet by mouth daily.    Dispense:  90 tablet    Refill:  0   lisinopril (ZESTRIL) 20 MG tablet    Sig: Take 1 tablet (20 mg total) by mouth daily. TAKE 1 TABLET(10 MG) BY MOUTH DAILY    Dispense:  90 tablet    Refill:  2    Return in about 6 months (around 10/26/2023) for Hypertension, ADHD.    This visit occurred during the SARS-CoV-2 public health emergency.  Safety protocols were in place, including screening questions prior to the visit, additional usage of staff PPE, and extensive cleaning of exam room while observing appropriate contact time as indicated for disinfecting solutions.

## 2023-05-12 ENCOUNTER — Ambulatory Visit (INDEPENDENT_AMBULATORY_CARE_PROVIDER_SITE_OTHER): Payer: Self-pay

## 2023-05-12 VITALS — BP 139/78 | HR 75

## 2023-05-12 DIAGNOSIS — I10 Essential (primary) hypertension: Secondary | ICD-10-CM

## 2023-05-12 NOTE — Progress Notes (Signed)
   Established Patient Office Visit  Subjective   Patient ID: [name] [name], male    DOB: 10-06-1970  Age: 52 y.o. MRN: [id number]  Chief Complaint  Patient presents with   Hypertension    HPI  [name] [name] is here for blood pressure check. Denies chest pain or dizziness.   ROS    Objective:     BP 139/78   Pulse 75   SpO2 99%    Physical Exam   No results found for any visits on 05/12/23.    The ASCVD Risk score (Arnett DK, et al., 2019) failed to calculate for the following reasons:   Cannot find a previous HDL lab   Cannot find a previous total cholesterol lab    Assessment & Plan:  HTN - Patient advised to continue current medications as directed.   Problem List Items Addressed This Visit       Unprioritized   Essential hypertension - Primary    Return in about 3 months (around 08/12/2023) for HTN with Dr Ashley Royalty. Earna Coder, Janalyn Harder, CMA

## 2023-06-22 LAB — CMP 10231: EGFR: 78

## 2023-07-13 ENCOUNTER — Telehealth: Payer: Self-pay | Admitting: Family Medicine

## 2023-07-13 NOTE — Telephone Encounter (Signed)
Patient is wanting to get some medication for his gout he was diagnose with this by another dr he has a appt on Monday     but has a flare up now and would like a call back

## 2023-07-14 ENCOUNTER — Other Ambulatory Visit: Payer: Self-pay | Admitting: Family Medicine

## 2023-07-14 MED ORDER — ALLOPURINOL 100 MG PO TABS
200.0000 mg | ORAL_TABLET | Freq: Every day | ORAL | 1 refills | Status: DC
Start: 2023-07-14 — End: 2023-09-08

## 2023-07-17 ENCOUNTER — Ambulatory Visit: Payer: Self-pay | Admitting: Family Medicine

## 2023-07-18 MED ORDER — INDOMETHACIN 50 MG PO CAPS: 50.0000 mg | ORAL_CAPSULE | Freq: Three times a day (TID) | ORAL | 0 refills | Status: DC

## 2023-07-19 ENCOUNTER — Other Ambulatory Visit: Payer: Self-pay

## 2023-07-19 NOTE — Telephone Encounter (Signed)
Patient informed. 

## 2023-07-19 NOTE — Telephone Encounter (Signed)
Patient requesting rx rf of Adderall 30mg   Last written 04/28/2023 Last OV 04/28/2023 Next schld appt 07/20/23

## 2023-07-20 ENCOUNTER — Ambulatory Visit: Payer: Self-pay | Admitting: Family Medicine

## 2023-07-20 ENCOUNTER — Telehealth: Payer: Self-pay

## 2023-07-20 ENCOUNTER — Other Ambulatory Visit: Payer: Self-pay

## 2023-07-20 MED ORDER — AMPHETAMINE-DEXTROAMPHETAMINE 30 MG PO TABS: 30.0000 mg | ORAL_TABLET | Freq: Every day | ORAL | 0 refills | Status: AC

## 2023-07-20 NOTE — Telephone Encounter (Signed)
Adderall rx refill sent to pharmacy today

## 2023-07-20 NOTE — Telephone Encounter (Signed)
Reqeusting rx rf of adderall 30mg   Last written 04/28/2023 Last OV 04/28/2023 Upcoming appt 07/20/2023

## 2023-07-20 NOTE — Telephone Encounter (Signed)
Copied from CRM [id number]. Topic: Clinical - Medication Question >> Jul 19, 2023  2:38 PM Colletta Maryland S wrote: Reason for CRM: pt is following up about amphetamine-dextroamphetamine (ADDERALL) 30 MG tablet rx being refilled, pharmacy informed pt they have not received order

## 2023-07-20 NOTE — Telephone Encounter (Signed)
Copied from CRM [id number]. Topic: Clinical - Medication Question >> Jul 18, 2023  9:56 AM [name] [name] wrote: Reason for CRM: Patient wants to know if Dr. Ashley Royalty could go ahead and send a refill request for amphetamine-dextroamphetamine (ADDERALL) 30 MG tablet before his visit / please call [phone]

## 2023-07-21 NOTE — Telephone Encounter (Signed)
Refill sent yesterday and patient schld for 07/27/23 for f/u visit.

## 2023-07-27 ENCOUNTER — Ambulatory Visit: Payer: Self-pay | Admitting: Family Medicine

## 2023-08-11 ENCOUNTER — Ambulatory Visit: Payer: Self-pay | Admitting: Family Medicine

## 2023-08-18 ENCOUNTER — Ambulatory Visit: Payer: Self-pay | Admitting: Family Medicine

## 2023-08-24 ENCOUNTER — Encounter: Payer: Self-pay | Admitting: Family Medicine

## 2023-08-24 ENCOUNTER — Ambulatory Visit (INDEPENDENT_AMBULATORY_CARE_PROVIDER_SITE_OTHER): Payer: Worker's Compensation | Admitting: Family Medicine

## 2023-08-24 VITALS — BP 119/81 | HR 83 | Ht 68.0 in | Wt 264.0 lb

## 2023-08-24 DIAGNOSIS — R6882 Decreased libido: Secondary | ICD-10-CM

## 2023-08-24 DIAGNOSIS — F909 Attention-deficit hyperactivity disorder, unspecified type: Secondary | ICD-10-CM

## 2023-08-24 DIAGNOSIS — M109 Gout, unspecified: Secondary | ICD-10-CM | POA: Insufficient documentation

## 2023-08-24 DIAGNOSIS — I10 Essential (primary) hypertension: Secondary | ICD-10-CM

## 2023-08-24 NOTE — Progress Notes (Signed)
[name] [name] - 53 y.o. male MRN [id number]  Date of birth: 10-14-1970  Subjective Chief Complaint  Patient presents with   Hypertension   Testosterone    HPI [name] [name] is a 53 y.o. male here today for follow up visit.   He reports that he is doing well.  He continues on adderall 30mg  tid.  This continues to work pretty well for him.  He has not noted significant side effects related to this. He is considering coming off of this.    BP is well controlled with lisinopril 20mg  daily.  He denies side effects from medication at current strength.    He is concerned about low testosterone levels and would like to have this checked.    ROS:  A comprehensive ROS was completed and negative except as noted per HPI  Allergies  Allergen Reactions   Morphine Hives    Past Medical History:  Diagnosis Date   ADHD (attention deficit hyperactivity disorder)    Arthritis    Chest pain    Related to mental stress   History of anxiety    History of depression    Hypertension     Past Surgical History:  Procedure Laterality Date   ANTERIOR CERVICAL DECOMP/DISCECTOMY FUSION     INGUINAL HERNIA REPAIR     KNEE ARTHROSCOPY Right    LIGAMENT REPAIR Right 11/24/2021   Procedure: Right Ankle Lateral Ligament Reconstruction;  Surgeon: Netta Cedars, MD;  Location: [facility];  Service: Orthopedics;  Laterality: Right;    Social History   Socioeconomic History   Marital status: Single    Spouse name: Not on file   Number of children: Not on file   Years of education: Not on file   Highest education level: Not on file  Occupational History   Not on file  Tobacco Use   Smoking status: Never    Passive exposure: Never   Smokeless tobacco: Current    Types: Chew  Vaping Use   Vaping status: Not on file  Substance and Sexual Activity   Alcohol use: Not Currently    Alcohol/week: 0.0 - 2.0 standard drinks of alcohol    Comment: occassionally   Drug use: Never    Sexual activity: Yes    Partners: Female  Other Topics Concern   Not on file  Social History Narrative   Not on file   Social Drivers of Health   Financial Resource Strain: Not on file  Food Insecurity: No Food Insecurity (10/14/2020)   Received from [facility], [facility]   Hunger Vital Sign    Worried About Running Out of Food in the Last Year: Never true    Ran Out of Food in the Last Year: Never true  Transportation Needs: Not on file  Physical Activity: Not on file  Stress: Not on file  Social Connections: Unknown (12/11/2021)   Received from [facility], [facility]   Social Network    Social Network: Not on file    Family History  Problem Relation Age of Onset   Heart disease Mother    Hypertension Father    Bone cancer Father     Health Maintenance  Topic Date Due   COVID-19 Vaccine (1 - 2024-25 season) Never done   INFLUENZA VACCINE  11/06/2023 (Originally 03/09/2023)   Zoster Vaccines- Shingrix (1 of 2) 01/24/2024 (Originally 05/09/2021)   Colonoscopy  04/27/2024 (Originally 05/09/2016)   Hepatitis C Screening  04/27/2024 (Originally 05/09/1989)  HIV Screening  04/27/2024 (Originally 05/09/1986)   DTaP/Tdap/Td (2 - Td or Tdap) 08/09/2027   HPV VACCINES  Aged Out     ----------------------------------------------------------------------------------------------------------------------------------------------------------------------------------------------------------------- Physical Exam BP 119/81 (BP Location: Left Arm, Patient Position: Sitting, Cuff Size: Large)   Pulse 83   Ht 5\' 8"  (1.727 m)   Wt 264 lb (119.7 kg)   SpO2 98%   BMI 40.14 kg/m   Physical Exam Constitutional:      Appearance: Normal appearance.  HENT:     Head: Normocephalic and atraumatic.  Cardiovascular:     Rate and Rhythm: Normal rate and regular rhythm.  Pulmonary:     Effort: Pulmonary effort is normal.     Breath sounds: Normal breath sounds.  Neurological:      General: No focal deficit present.     Mental Status: He is alert.  Psychiatric:        Mood and Affect: Mood normal.        Behavior: Behavior normal.     ------------------------------------------------------------------------------------------------------------------------------------------------------------------------------------------------------------------- Assessment and Plan  ADHD (attention deficit hyperactivity disorder) Overall he has done pretty well with adderall at current strength.  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   Essential hypertension BP is well controlled with lisinopril at current strength.  Recommend continuation.     Gout Recommend reduction in EtOH intake.  Update uric acid.    No orders of the defined types were placed in this encounter.   No follow-ups on file.    This visit occurred during the SARS-CoV-2 public health emergency.  Safety protocols were in place, including screening questions prior to the visit, additional usage of staff PPE, and extensive cleaning of exam room while observing appropriate contact time as indicated for disinfecting solutions.

## 2023-08-24 NOTE — Assessment & Plan Note (Addendum)
BP is well controlled with lisinopril at current strength.  Recommend continuation.

## 2023-08-24 NOTE — Assessment & Plan Note (Signed)
Recommend reduction in EtOH intake.  Update uric acid.

## 2023-08-24 NOTE — Assessment & Plan Note (Signed)
Overall he has done pretty well with adderall at current strength.  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

## 2023-08-25 LAB — URIC ACID: Uric Acid: 7.3 mg/dL (ref 3.8–8.4)

## 2023-08-25 LAB — TESTOSTERONE: Testosterone: 188 ng/dL — ABNORMAL LOW (ref 264–916)

## 2023-09-08 ENCOUNTER — Encounter: Payer: Self-pay | Admitting: Family Medicine

## 2023-09-08 ENCOUNTER — Other Ambulatory Visit: Payer: Self-pay | Admitting: Family Medicine

## 2023-09-08 DIAGNOSIS — R7989 Other specified abnormal findings of blood chemistry: Secondary | ICD-10-CM

## 2023-09-08 MED ORDER — ALLOPURINOL 300 MG PO TABS: 300.0000 mg | ORAL_TABLET | Freq: Every day | ORAL | 5 refills | Status: AC

## 2023-09-15 LAB — PROLACTIN: Prolactin: 13 ng/mL (ref 3.6–25.2)

## 2023-09-15 LAB — TESTOSTERONE, FREE, TOTAL, SHBG
Sex Hormone Binding: 29.8 nmol/L (ref 19.3–76.4)
Testosterone, Free: 3.1 pg/mL — ABNORMAL LOW (ref 7.2–24.0)
Testosterone: 134 ng/dL — ABNORMAL LOW (ref 264–916)

## 2023-09-15 LAB — FSH/LH
FSH: 4.9 m[IU]/mL (ref 1.5–12.4)
LH: 6 m[IU]/mL (ref 1.7–8.6)

## 2023-09-15 LAB — TSH: TSH: 1.49 u[IU]/mL (ref 0.450–4.500)

## 2023-09-21 ENCOUNTER — Other Ambulatory Visit: Payer: Self-pay | Admitting: Family Medicine

## 2023-09-21 ENCOUNTER — Encounter: Payer: Self-pay | Admitting: Family Medicine

## 2023-09-21 DIAGNOSIS — E291 Testicular hypofunction: Secondary | ICD-10-CM

## 2023-09-22 ENCOUNTER — Other Ambulatory Visit: Payer: Self-pay

## 2023-09-22 MED ORDER — LISINOPRIL 20 MG PO TABS: 20.0000 mg | ORAL_TABLET | Freq: Every day | ORAL | 2 refills | Status: DC

## 2023-10-10 ENCOUNTER — Telehealth: Payer: Self-pay | Admitting: Family Medicine

## 2023-10-10 NOTE — Telephone Encounter (Signed)
 Copied from CRM [id number]. Topic: Clinical - Lab/Test Results >> Oct 10, 2023  3:05 PM Clayton Bibles wrote: Reason for CRM: [name] [name] wants a nurse to call him and explain his lab results in detail.  He wants to know what this means:Testosterone levels are low, however the pituitary hormones are abnormally normal (FSH/LH should be elevated is testosterone is low).  I would like for you to see endocrinology as there may be more to this than just low testosterone.     Please call him back at [phone]

## 2023-10-11 NOTE — Telephone Encounter (Signed)
 Spoke with patient and reviewed lab results together.   Sending to referral team to see if Endo referral has been sent as was placed on 09/21/23

## 2023-10-27 ENCOUNTER — Encounter: Payer: Self-pay | Admitting: Family Medicine

## 2023-10-27 ENCOUNTER — Ambulatory Visit: Payer: Self-pay | Admitting: Family Medicine

## 2023-11-03 ENCOUNTER — Other Ambulatory Visit: Payer: Self-pay | Admitting: Family Medicine

## 2023-11-03 ENCOUNTER — Ambulatory Visit: Payer: Self-pay | Admitting: Family Medicine

## 2023-11-15 ENCOUNTER — Telehealth: Payer: Self-pay

## 2023-11-15 ENCOUNTER — Other Ambulatory Visit: Payer: Self-pay

## 2023-11-15 MED ORDER — LISINOPRIL 20 MG PO TABS: 20.0000 mg | ORAL_TABLET | Freq: Every day | ORAL | 0 refills | Status: AC

## 2023-11-15 NOTE — Telephone Encounter (Signed)
 Task completed. Patient has been updated of the refills for lisinopril and Adderall. Verbalized understanding. Lisinopril sent in as #30 to [facility] pharmacy.

## 2023-11-15 NOTE — Telephone Encounter (Signed)
 Please advise, thanks.   Copied from CRM [id number]. Topic: Clinical - Medication Question >> Nov 15, 2023 12:45 PM Antony Haste wrote: Reason for CRM: The patient was discharged from the practice effective 10/27/2023 but he is requesting to speak with someone about receiving a refill of Adderall and Lisinopril. Unable to gain further clarification with CAL due to lunch hours.  Callback #:[phone]

## 2023-12-29 ENCOUNTER — Encounter: Payer: Self-pay | Admitting: Endocrinology

## 2023-12-29 ENCOUNTER — Ambulatory Visit (INDEPENDENT_AMBULATORY_CARE_PROVIDER_SITE_OTHER): Payer: Self-pay | Admitting: Endocrinology

## 2023-12-29 VITALS — BP 144/100 | HR 89 | Resp 20 | Ht 68.0 in | Wt 264.8 lb

## 2023-12-29 DIAGNOSIS — R7989 Other specified abnormal findings of blood chemistry: Secondary | ICD-10-CM

## 2023-12-29 NOTE — Progress Notes (Signed)
 Outpatient Endocrinology Note [place] Kimerly Rowand, MD   Patient's Name: [name] [name].    DOB: [birth year]    MRN: [id number]  REASON OF VISIT: New consult for low testosterone .  REFERRING PROVIDER: Adela Holter, DO  PCP:  Cory Dingwall, NP  HISTORY OF PRESENT ILLNESS:   [name] Bakes Macmullen Marieta Shorten. is a 53 y.o. old male with past medical history listed below, is here for new consult for low testosterone .  Pertinent Hx: Patient is referred to endocrinology for evaluation and management of low testosterone .  In January 2025 patient requested to test for testosterone  level and was 188 total testosterone  in the late morning.  He had rechecked labs on September 12, 2023 in the morning, showed low total and free testosterone  including around mid normal gonadotropins, normal prolactin and low normal sex hormone binding globulin.  Patient reports he had cervical neck surgery on January 31 and had testosterone  related labs on September 12, 2023.  He reports normal libido and no erectile dysfunction.  He has complaints of low energy and low motivation.  No new headache, peripheral vision loss, no galactorrhea or any breast issues.  Libido:                     Fair Erectile Dysfunction: No Gynecomastia:           No Fathered any child:     Yes, 38 year daughter in 2025.  Shaving less often:     No Opioid use:                  No Decreased strength    No Decreased Muscle      No Tiredness/Fatigue       Yes Anabolic steroids:        No Weight lifting:              No Excessive exercise:    No Fractures:                    No Vision issues               No Sense of Smell           Intact Truama, Radiation, Mumps  No.  Denies lump in the testicle or change in size of testicles.  Labs:   Latest Reference Range & Units 08/24/23 11:14 09/12/23 08:19  LH 1.7 - 8.6 mIU/mL  6.0  FSH 1.5 - 12.4 mIU/mL  4.9  Prolactin 3.6 - 25.2 ng/mL  13.0  Sex Horm Binding Glob, Serum 19.3 - 76.4 nmol/L  29.8  Testosterone  264  - 916 ng/dL 409 (L) 811 (L)  Testosterone  Free 7.2 - 24.0 pg/mL  3.1 (L)  (L): Data is abnormally low  Patient recently started to take total T from [employer] over-the-counter had taken couple of doses so far.  He has not been on prescription testosterone  therapy.    He reports the snoring and breathing trouble during sleep noticed by the wife.  He was not tested or diagnosed with obstructive sleep apnea.  He is accompanied by wife in the clinic today.  He had normal thyroid function test recently.  REVIEW OF SYSTEMS:  As per history of present illness.   PAST MEDICAL HISTORY: Past Medical History:  Diagnosis Date   ADHD (attention deficit hyperactivity disorder)    Arthritis    Chest pain    Related to mental stress   History of anxiety  History of depression    Hypertension     PAST SURGICAL HISTORY: Past Surgical History:  Procedure Laterality Date   ANTERIOR CERVICAL DECOMP/DISCECTOMY FUSION     INGUINAL HERNIA REPAIR     KNEE ARTHROSCOPY Right    LIGAMENT REPAIR Right 11/24/2021   Procedure: Right Ankle Lateral Ligament Reconstruction;  Surgeon: Ali Ink, MD;  Location: [facility];  Service: Orthopedics;  Laterality: Right;    ALLERGIES: Allergies  Allergen Reactions   Morphine Hives    FAMILY HISTORY:  Family History  Problem Relation Age of Onset   Heart disease Mother    Hypertension Father    Bone cancer Father     SOCIAL HISTORY: Social History   Socioeconomic History   Marital status: Single    Spouse name: Not on file   Number of children: Not on file   Years of education: Not on file   Highest education level: Not on file  Occupational History   Not on file  Tobacco Use   Smoking status: Never    Passive exposure: Never   Smokeless tobacco: Current    Types: Chew  Vaping Use   Vaping status: Not on file  Substance and Sexual Activity   Alcohol use: Not Currently    Alcohol/week: 0.0 - 2.0 standard drinks of  alcohol    Comment: occassionally   Drug use: Never   Sexual activity: Yes    Partners: Female  Other Topics Concern   Not on file  Social History Narrative   Not on file   Social Drivers of Health   Financial Resource Strain: Not on file  Food Insecurity: No Food Insecurity (10/14/2020)   Received [facility]   Hunger Vital Sign    Worried About Running Out of Food in the Last Year: Never true    Ran Out of Food in the Last Year: Never true  Transportation Needs: Not on file  Physical Activity: Not on file  Stress: Not on file  Social Connections: Unknown (12/11/2021)   Received from [facility]   Social Network    Social Network: Not on file    MEDICATIONS:  Current Outpatient Medications  Medication Sig Dispense Refill   allopurinol  (ZYLOPRIM ) 300 MG tablet Take 1 tablet (300 mg total) by mouth daily. 30 tablet 5   amphetamine -dextroamphetamine  (ADDERALL) 30 MG tablet Take 1 tablet by mouth daily. 90 tablet 0   Aspirin-Caffeine (BC FAST PAIN RELIEF PO) Take by mouth.     fexofenadine (ALLEGRA) 60 MG tablet Take 60 mg by mouth 2 (two) times daily.     ibuprofen  (ADVIL ) 800 MG tablet Take 1 tablet (800 mg total) by mouth every 8 (eight) hours as needed for moderate pain. 30 tablet 3   lisinopril  (ZESTRIL ) 20 MG tablet Take 1 tablet (20 mg total) by mouth daily. TAKE 1 TABLET(10 MG) BY MOUTH DAILY (Patient taking differently: Take 30 mg by mouth daily. TAKE 1 TABLET BY MOUTH DAILY) 30 tablet 0   Multiple Vitamins-Minerals (MULTIVITAL PO) Take by mouth.     No current facility-administered medications for this visit.    PHYSICAL EXAM: Vitals:   12/29/23 1006  BP: (!) 144/100  Pulse: 89  Resp: 20  SpO2: 98%  Weight: 264 lb 12.8 oz (120.1 kg)  Height: 5\' 8"  (1.727 m)   Body mass index is 40.26 kg/m.  Wt Readings from Last 3 Encounters:  12/29/23 264 lb 12.8 oz (120.1 kg)  08/24/23  264 lb (119.7 kg)  04/28/23 272 lb (123.4 kg)     General: Well developed, well nourished male in no apparent distress. Non-Cushingoid. No obvious Klinefelter's syndrome body habitus HEENT: AT/[place], no external lesions. Hearing intact to the spoken word Eyes: EOMI. No proptosis, stare and lid lag. Conjunctiva clear and no icterus. Visual field grossly intact in confrontation Neck: Trachea midline, neck supple without appreciable thyromegaly or lymphadenopathy and no palpable thyroid nodules Lungs: Clear to auscultation, no wheeze. Respirations not labored Heart: S1S2, Regular in rate and rhythm.  Abdomen: Soft, non tender, non distended, no masses, no striae Neurologic: Alert, oriented, normal speech, deep tendon biceps reflexes normal,  no gross focal neurological deficit. No obvious proximal weakness Extremities: No pedal pitting edema, no tremors of outstretched hands, no excessive hair growth Skin: Warm, color good. Psychiatric: Does not appear depressed or anxious  PERTINENT HISTORIC LABORATORY AND IMAGING STUDIES:  All pertinent laboratory results were reviewed. Please see HPI also for further details.   ASSESSMENT / PLAN  1. Low testosterone  in male    - Patient had lab in January with mildly low total testosterone  which was checked in the late morning.  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  However he had cervical neck surgery 5 days prior to the testosterone  related labs.  He has low normal sex hormone binding globulin, which can make total testosterone  appropriately low.  Has complaints of low energy and low motivation however reports fair libido and no erectile dysfunction.  He has recently been taking total T supplement over-the-counter from [employer]. - In the clinical context of having surgery few days prior to the lab in February, ?  Testosterone  level may have been  physiologically low at that time.  I would like to repeat the lab in a couple of months to reevaluate for low testosterone .  Plan: - Check total, free testosterone  along with gonadotropins in the early morning fasting in 3 months. - Patient is asked to stop total T supplement he is currently taking from [facility], allowing time to wear off any oral supplement and rechecking lab. - If the repeat laboratory test consistent with having secondary hypogonadism we will plan for MRI pituitary. -I would also like to check iron studies to evaluate for low testosterone . - Patient complains of snoring and breathing trouble during sleep I recommended to discuss with primary care provider to have sleep study done to be rule out obstructive sleep apnea as well. - Patient currently does not have medical insurance and working on to get medical plan.  Patient will call for lab visit at appropriate time and when ready.  Diagnoses and all orders for this visit:  Low testosterone  in male -     Testosterone  , Free and Total -     Iron, TIBC and Ferritin Panel -     Follicle stimulating hormone -     Luteinizing hormone    DISPOSITION Follow up in clinic in to be determined plan based on above lab plan.  All questions answered and patient verbalized understanding of the plan.  [place] Rosi Secrist, MD [facility] [address] [place], [address] [place], [place] [address] Phone # [phone]  At least part of this note was generated using voice recognition software. Inadvertent word errors may have occurred, which were not recognized during the proofreading process.

## 2024-01-26 ENCOUNTER — Ambulatory Visit: Payer: Self-pay | Admitting: Family Medicine

## 2024-02-21 ENCOUNTER — Ambulatory Visit: Payer: Self-pay | Admitting: Family Medicine
